# Patient Record
Sex: Female | Born: 2000 | Race: Black or African American | Hispanic: No | Marital: Single | Smoking: Never smoker
Health system: Southern US, Community
[De-identification: ages and names within clinical notes are randomized; demographics above are authoritative.]

---
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
## 2021-12-01 IMAGING — DX DG CHEST 2V
2 series · 2 of 2 positions shown · non-contrast
Comparison: None.

CLINICAL DATA: Chest pain

EXAM:
CHEST - 2 VIEW

[chest pa]
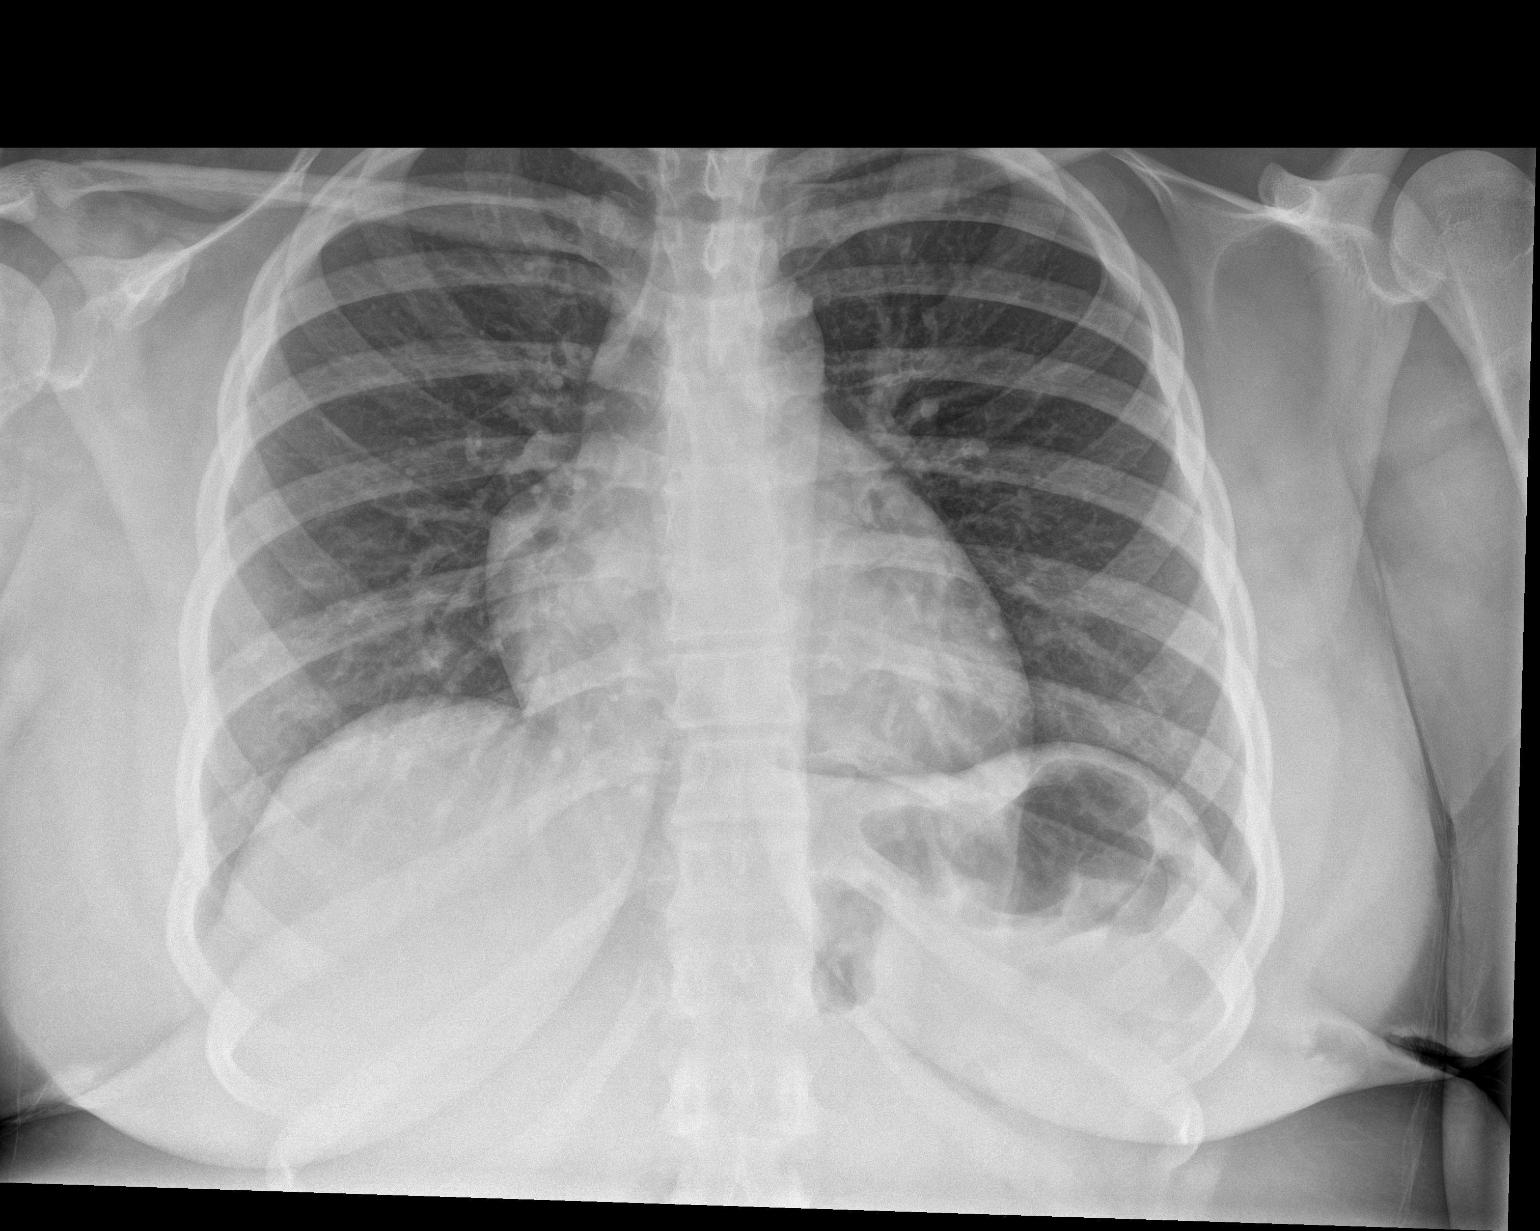

[chest lat]
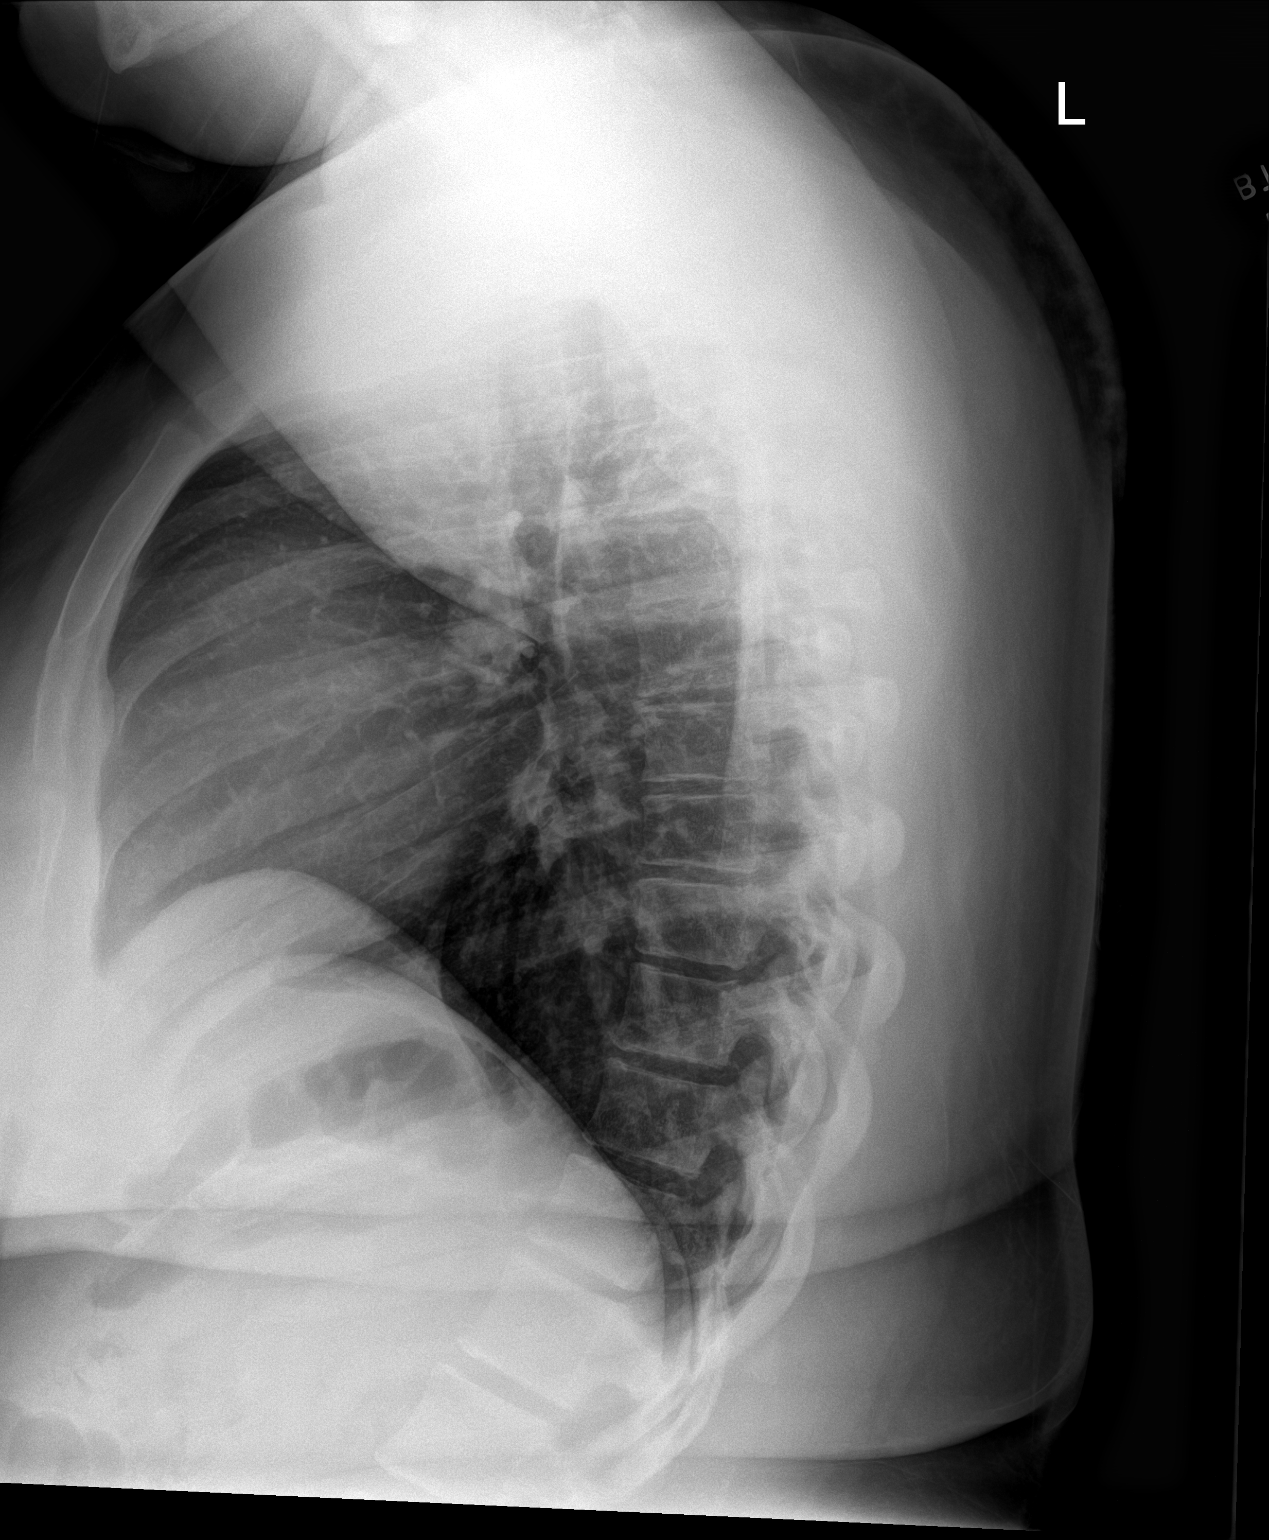

[2 of 2 positions shown; findings below may reference images not displayed]

FINDINGS: The heart size and mediastinal contours are within normal limits.
Both lungs are clear. The visualized skeletal structures are
unremarkable.
IMPRESSION: No active cardiopulmonary disease.

## 2024-02-12 DIAGNOSIS — G43009 Migraine without aura, not intractable, without status migrainosus: Secondary | ICD-10-CM | POA: Insufficient documentation

## 2024-02-12 DIAGNOSIS — R4586 Emotional lability: Secondary | ICD-10-CM | POA: Insufficient documentation

## 2024-05-18 NOTE — Progress Notes (Unsigned)
   LILLETTE Ileana Collet, PhD, LAT, ATC acting as a scribe for Artist Lloyd, MD.  Elizabeth Stein is a 23 y.o. female who presents to [facility] at [facility] today for R wrist pain x ***. Pt locates pain to ***  Radiates: Paresthesia: Grip strength: Aggravates: Treatments tried: meloxicam, wrist brace  Dx imaging: 05/06/24 R wrist XR  Pertinent review of systems: ***  Relevant historical information: ***   Exam:  There were no vitals taken for this visit. General: Well Developed, well nourished, and in no acute distress.   MSK: ***    Lab and Radiology Results No results found for this or any previous visit (from the past 72 hours). No results found.     Assessment and Plan: 23 y.o. female with ***   PDMP not reviewed this encounter. No orders of the defined types were placed in this encounter.  No orders of the defined types were placed in this encounter.    Discussed warning signs or symptoms. Please see discharge instructions. Patient expresses understanding.   ***

## 2024-05-19 ENCOUNTER — Other Ambulatory Visit: Payer: Self-pay

## 2024-05-19 ENCOUNTER — Encounter: Payer: Self-pay | Admitting: Family Medicine

## 2024-05-19 ENCOUNTER — Ambulatory Visit (INDEPENDENT_AMBULATORY_CARE_PROVIDER_SITE_OTHER): Admitting: Family Medicine

## 2024-05-19 VITALS — BP 104/64 | HR 71 | Ht 64.0 in | Wt 244.0 lb

## 2024-05-19 DIAGNOSIS — M25531 Pain in right wrist: Secondary | ICD-10-CM | POA: Diagnosis not present

## 2024-05-19 NOTE — Patient Instructions (Addendum)
 Thank you for coming in today.   ECU (extensor carpi ulnaris) tendinitis  Please work on the home exercises the athletic trainer went over with you:  View at [email or web address]-exercise-[email or web address] code [id number]  Thumb spica wrist brace  Please use Voltaren gel (Generic Diclofenac Gel) up to 4x daily for pain as needed.  This is available over-the-counter as both the name brand Voltaren gel and the generic diclofenac gel.   Let me know if not getting better, let me know.  Let me know if you need a work note

## 2024-05-20 NOTE — Telephone Encounter (Signed)
 Spoke with patient, letter sent via MyChart for 10 lb lifting restriction and to allow her to wear brace while working.

## 2024-06-06 ENCOUNTER — Encounter: Payer: Self-pay | Admitting: Family Medicine

## 2024-06-06 ENCOUNTER — Telehealth: Payer: Self-pay | Admitting: Family Medicine

## 2024-06-06 NOTE — Telephone Encounter (Signed)
 Per letter 05/20/24:  To Whom It May Concern:   It is my medical opinion that Elizabeth Stein may return to light duty immediately with the following restrictions:   No lifting > 10 lbs Allow to wear wrist brace while working Maintain normal work hours and break schedule   If you have any questions or concerns, please don't hesitate to call.   Sincerely,       Artist Lloyd, MD

## 2024-06-06 NOTE — Telephone Encounter (Signed)
 Patient called asking how long Dr Joane thought that she would need to be on light duty and if she should be working over time? She states that she has still not been moved from her station at work  Please advise.  (Okay to leave a message)

## 2024-06-07 NOTE — Telephone Encounter (Signed)
 Okay to continue light duty restrictions and avoid overtime.

## 2024-06-08 NOTE — Telephone Encounter (Signed)
 Spoke with patient, work note send via [employer] cyt[employer]ist.

## 2024-06-08 NOTE — Telephone Encounter (Signed)
 Letter drafted and sent to pt via My Chart

## 2024-06-08 NOTE — Telephone Encounter (Signed)
 Called and spoke with pt to get clarification about request. She notes that her employer has not provided any of the requested accommodations and wants for her to work overtime.   I will send new note reiterating requesting accommodations, also adding no overtime, and requesting that workplace accommodation form be sent to the office.

## 2024-06-27 NOTE — Telephone Encounter (Signed)
 Pt called stating that her work wanted to to come off of her light duty restrictions. Pt notes cont'd wrist pain. Discussed Dr. Virgilio plan including; OT referral, injection, or MRI-arthrogram. Pt preferred a f/u visit w/ Dr. Leonce to discuss. Visit scheduled. Pt verbalized understanding.

## 2024-07-06 NOTE — Progress Notes (Signed)
 [name] [address] [place] [address] Phone: [phone]   Assessment and Plan:     1. 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  Come out of brace at least 1-2 times per day to perform HEP - Start PT/OT for hand/wrist  Pertinent previous records reviewed include none  Follow Up: 3 weeks for reevaluation.  If no improvement or worsening of symptoms, would consider ECU CSI versus imaging   Subjective:   I, Elizabeth Stein, am serving as a Neurosurgeon for Doctor Morene Mace  Chief Complaint: right wrist pain   HPI:   05/19/2024 Elizabeth Stein is a 23 y.o. female who presents to [facility] at [facility] today for R wrist pain x 2-3 months. Pt locates pain to medial aspect of the wrist. Notes frequent popping in the wrist joint. Denies visible swelling but doc at Urgent Care felt that there was swelling present. Notes pain radiating into the 3rd and 4th digits. Denies n/t/w or decreased grip strength. Denies injury. Has tried bracing, Naproxen, and Tylenol. Gets the most relief with Tylenol and bracing.     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  This does involve a fair amount of ulnar deviation.   Radiates: 3rd and 4th digits Paresthesia: denies Grip strength: normal  Aggravates: ROM, painful popping Treatments tried: meloxicam, wrist brace, Tylenol   Dx imaging: 05/06/24 R wrist XR   Pertinent review of systems: No fevers or chills   Relevant historical information: Obesity    Exam:  BP 104/64   Pulse 71   Ht 5' 4 (1.626 m)   Wt 244 lb (110.7 kg)   SpO2 98%   BMI 41.88 kg/m  General: Well Developed, well nourished, and in no acute distress.    MSK: Right wrist normal-appearing Tender palpation overlying ulnar styloid.  Normal wrist motion.  Intact strength. Pain with resisted wrist extension and ulnar deviation  07/07/2024 Patient states pain has gotten worse. Pain is waking her now .    Relevant Historical Information: None pertinent  Additional pertinent review of systems negative.   Current Outpatient Medications:    methylPREDNISolone  (MEDROL  DOSEPAK) 4 MG TBPK tablet, Take 6 tablets on day 1.  Take 5 tablets on day 2.  Take 4 tablets on day 3.  Take 3 tablets on day 4.  Take 2 tablets on day 5.  Take 1 tablet on day 6., Disp: 21 tablet, Rfl: 0   naproxen (NAPROSYN) 500 MG tablet, Take 500 mg by mouth., Disp: , Rfl:    norethindrone (MICRONOR) 0.35 MG tablet, Take 0.35 mg by mouth., Disp: , Rfl:    ondansetron (ZOFRAN-ODT) 4 MG disintegrating tablet, Take 4 mg by mouth., Disp: , Rfl:    Plecanatide 3 MG TABS, Take by mouth., Disp: , Rfl:    polyethylene glycol (MIRALAX / GLYCOLAX) 17 g packet, Take 17 g by mouth., Disp: , Rfl:    rizatriptan (MAXALT) 10 MG tablet, Take 10 mg by mouth., Disp: , Rfl:    Objective:     Vitals:  07/07/24 1032  BP: 106/74  Pulse: 85  SpO2: 100%  Weight: 244 lb (110.7 kg)  Height: 5' 4 (1.626 m)      Body mass index is 41.88 kg/m.    Physical Exam:    General: Appears well, nad, nontoxic and pleasant Neuro:sensation intact, strength is 5/5 in upper extremities, muscle tone wnl Skin:no susupicious lesions or rashes  Right hand/Wrist:   No deformity or swelling appreciated. ROM  Ext 90, flexion 70, radial/ulnar deviation 30 with mild pain with ulnar deviation TTP mildly ulnar styloid nttp over the snuff box, dorsal carpals, volar carpals, radial styloid,   , 1st mcp, tfcc Negative Tinel's, Phalen's,  Prayer Tests Negative finklestein Neg tfcc bounce test No pain with resisted ext, flex or deviation  Subluxation of ECU tendon over ulnar styloid with wrist rotation creating tenderness  Electronically signed by:  Odis Mace D.CLEMENTEEN [facility] 10:57 AM 07/07/24

## 2024-07-07 ENCOUNTER — Ambulatory Visit: Admitting: Sports Medicine

## 2024-07-07 VITALS — BP 106/74 | HR 85 | Ht 64.0 in | Wt 244.0 lb

## 2024-07-07 DIAGNOSIS — M25531 Pain in right wrist: Secondary | ICD-10-CM

## 2024-07-07 DIAGNOSIS — M778 Other enthesopathies, not elsewhere classified: Secondary | ICD-10-CM | POA: Diagnosis not present

## 2024-07-07 MED ORDER — METHYLPREDNISOLONE 4 MG PO TBPK: ORAL_TABLET | ORAL | 0 refills | Status: AC

## 2024-07-07 NOTE — Patient Instructions (Addendum)
 Prednisone dos pak  Wear brace at all times day and night   Come out of brace 1-2 times per day to HEP   PT referral   3 week follow up   Work note provided no lifting more than 10 lbs no overtime. Wear wrist brae at all times 3 weeks

## 2024-07-08 NOTE — Addendum Note (Signed)
 Addended by: MAYBELL CHESTINE SAUNDERS on: 07/08/2024 07:33 AM   Modules accepted: Orders

## 2024-07-12 ENCOUNTER — Encounter: Payer: Self-pay | Admitting: Sports Medicine

## 2024-07-27 NOTE — Progress Notes (Unsigned)
 Ben Jackson D.CLEMENTEEN [facility] [address] [place] [address] Phone: [phone]   Assessment and Plan:     1. 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  Patient had a negative reaction to prednisone Dosepak, so discontinue prednisone. - May use topical Voltaren gel over areas of pain - Continue HEP as tolerated - Continue brace use - Start PT/OT for hand/wrist.  Patient was not contacted by their office to establish care, so we will provide a phone number for patient to reach out    Pertinent previous records reviewed include none   Follow Up: 1 week after MRI to review results and discuss treatment plan.  Could consider ECU CSI, the patient would ideally like to avoid injections.  Could consider ECSWT   Subjective:   I, Dawson Hollman, am serving as a Neurosurgeon for Doctor Morene Mace   Chief Complaint: right wrist pain    HPI:    05/19/2024 Elizabeth Stein is a 23 y.o. female who presents to [facility] at [facility] today for R wrist pain x 2-3 months. Pt locates pain to medial aspect of the wrist. Notes frequent popping in the wrist joint. Denies visible swelling but doc at Urgent Care felt that there was swelling present. Notes pain radiating into the 3rd and 4th digits. Denies n/t/w or decreased grip strength. Denies injury. Has tried bracing, Naproxen, and Tylenol. Gets the most relief with Tylenol and bracing.     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  This does involve a fair amount of ulnar deviation.   Radiates: 3rd and 4th digits Paresthesia:  denies Grip strength: normal  Aggravates: ROM, painful popping Treatments tried: meloxicam, wrist brace, Tylenol   Dx imaging: 05/06/24 R wrist XR   Pertinent review of systems: No fevers or chills   Relevant historical information: Obesity   Exam:  BP 104/64   Pulse 71   Ht 5' 4 (1.626 m)   Wt 244 lb (110.7 kg)   SpO2 98%   BMI 41.88 kg/m  General: Well Developed, well nourished, and in no acute distress.    MSK: Right wrist normal-appearing Tender palpation overlying ulnar styloid.  Normal wrist motion.  Intact strength. Pain with resisted wrist extension and ulnar deviation   07/07/2024 Patient states pain has gotten worse. Pain is waking her now .    07/28/2024 Patient states steroid caused more body pain than relief. Helped with the wrist but pain came back once she finished steroid  Relevant Historical Information: None pertinent  Additional pertinent review of systems negative.   Current Outpatient Medications:    methylPREDNISolone  (MEDROL  DOSEPAK) 4 MG TBPK tablet, Take 6 tablets on day 1.  Take 5 tablets on day 2.  Take 4 tablets on day 3.  Take 3 tablets on day 4.  Take 2 tablets on day 5.  Take 1 tablet on day 6., Disp: 21 tablet, Rfl: 0   naproxen (NAPROSYN) 500 MG tablet, Take 500 mg by mouth., Disp: , Rfl:    norethindrone (MICRONOR) 0.35 MG tablet, Take 0.35 mg by mouth., Disp: , Rfl:    ondansetron (ZOFRAN-ODT) 4 MG disintegrating  tablet, Take 4 mg by mouth., Disp: , Rfl:    Plecanatide 3 MG TABS, Take by mouth., Disp: , Rfl:    polyethylene glycol (MIRALAX / GLYCOLAX) 17 g packet, Take 17 g by mouth., Disp: , Rfl:    rizatriptan (MAXALT) 10 MG tablet, Take 10 mg by mouth., Disp: , Rfl:    Objective:     Vitals:   07/28/24 1037  Pulse: 77  SpO2: 98%  Weight: 259 lb (117.5 kg)  Height: 5' 4 (1.626 m)      Body mass index is 44.46 kg/m.    Physical Exam:    General: Appears well, nad, nontoxic and pleasant Neuro:sensation intact, strength is  5/5 in upper extremities, muscle tone wnl Skin:no susupicious lesions or rashes   Right hand/Wrist:   No deformity or swelling appreciated. ROM  Ext 90, flexion 70, radial/ulnar deviation 30 with mild pain with ulnar deviation TTP mildly ulnar styloid nttp over the snuff box, dorsal carpals, volar carpals, radial styloid,   , 1st mcp, tfcc Negative Tinel's, Phalen's, Prayer Tests Negative finklestein Neg tfcc bounce test No pain with resisted ext, flex or deviation  Subluxation of ECU tendon over ulnar styloid with wrist rotation creating tenderness    Electronically signed by:  [name] 10:48 AM 07/28/24

## 2024-07-28 ENCOUNTER — Ambulatory Visit (INDEPENDENT_AMBULATORY_CARE_PROVIDER_SITE_OTHER): Admitting: Sports Medicine

## 2024-07-28 VITALS — HR 77 | Ht 64.0 in | Wt 259.0 lb

## 2024-07-28 DIAGNOSIS — M778 Other enthesopathies, not elsewhere classified: Secondary | ICD-10-CM

## 2024-07-28 DIAGNOSIS — M25531 Pain in right wrist: Secondary | ICD-10-CM

## 2024-07-28 NOTE — Patient Instructions (Addendum)
 MRI right wrist   Continue brace use   Voltaren gel over areas of pain   Follow up 5 days after to discuss results

## 2024-07-31 NOTE — Therapy (Unsigned)
 OUTPATIENT OCCUPATIONAL THERAPY ORTHO EVALUATION  Patient Name: Elizabeth Stein MRN: [id number] DOB:October 23, 2001, 23 y.o., female Today's Date: 08/01/2024  PCP: Loreli Elyn SAILOR, MD  REFERRING PROVIDER: Leonce Katz, DO  END OF SESSION:  OT End of Session - 08/01/24 1249     Visit Number 1    Number of Visits 9    Date for OT Re-Evaluation 09/30/24    Authorization Type BC[employer] $50 Copay  ($0 MET/$2,500 DED)  ($344.23 MET/$6,000 OOPM)  AUTH REQUIRED  VL: 30 (PT,OT)--awaiting auth    OT Start Time [phone]    OT Stop Time 1015    OT Time Calculation (min) 44 min    Activity Tolerance Patient tolerated treatment well    Behavior During Therapy [facility] for tasks assessed/performed          No past medical history on file. No past surgical history on file. Patient Active Problem List   Diagnosis Date Noted   Migraine without aura and without status migrainosus, not intractable 02/12/2024   Mood disturbance 02/12/2024   Prediabetes 03/21/2020   Pure hypercholesterolemia 03/21/2020   Morbid obesity (HCC) 03/20/2020    ONSET DATE: 02/2024, first MD visit 04/2024  REFERRING DIAG: M25.531 (ICD-10-CM) - Right wrist pain M77.8 (ICD-10-CM) - Extensor carpi ulnaris tendinitis  THERAPY DIAG:  Pain in right wrist  Muscle weakness (generalized)  Localized edema  Rationale for Evaluation and Treatment: Rehabilitation  SUBJECTIVE:   SUBJECTIVE STATEMENT: Pt is wearing wrist brace almost all the time since June.  Pt reports pain started in April.  Pt reports somewhat better/somewhat worse since symptoms started.  Pt reports performing resisted wrist flex, ext, UD, and RD with band 1x/day which was given to her by MD.  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 This does involve a fair amount of ulnar deviation.  Notes frequent popping in the wrist  joint.  No improvement despite multiple NSAID courses, prednisone course, bracing, HEP.  Patient had a negative reaction to prednisone Dosepak, so discontinue prednisone.  Currently, per MD notes: pt is using topical Voltaren gel over areas of pain, continue HEP as tolerated, continue use or wrist brace, and start OT.  PMH:  hx of migraines, hypercholesterolemia  PRECAUTIONS: Other: only regular hours at work/no overtime, no lifting over 10lbs  WEIGHT BEARING RESTRICTIONS: No  PAIN:  Are you having pain? Yes: NPRS scale: 4/10 currently, 1-8/10 Pain location: R ulnar wrist/ulnar styloid Pain description: throbbing Aggravating factors: movement Relieving factors: wrist splint, OTC pain meds   FALLS: Has patient fallen in last 6 months? 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  HAND DOMINANCE: Right  ADLs: Overall ADLs: performing BADLs and IADLs mod I with RUE wearing splint, using LUE for heavier activities.  FUNCTIONAL OUTCOME MEASURES: Quick Dash: 40.9%  UPPER EXTREMITY ROM:   finger/thumb ROM WNL  Active ROM Right eval Left eval  Shoulder flexion    Shoulder abduction    Shoulder adduction    Shoulder extension  Shoulder internal rotation    Shoulder external rotation    Elbow flexion    Elbow extension    Wrist flexion WNL,mild pain   Wrist extension WNL with incr pain   Wrist ulnar deviation 25*  mild pain   Wrist radial deviation 25* mild pain   Wrist pronation    Wrist supination    (Blank rows = not tested)   UPPER EXTREMITY MMT:   wrist  strength grossly 4+/5 with 3/10 pain   HAND FUNCTION: Grip strength: Right: 26 lbs; Left: 30.4 lbs  COORDINATION: WNL  SENSATION: Pt denies numbness/tingling   EDEMA: mild edema noted dorsal hand ulnarly and at ulnar wrist  COGNITION: Overall cognitive status: Within functional limits for tasks assessed  OBSERVATIONS: not sensitive to touch at ulnar styloid   TREATMENT DATE:  08/01/24:  Evaluation completed.  Also see pt instructions.                                                                                                                              PATIENT EDUCATION: Education details: OT Eval results/POC.  Initial HEP for PROM wrist stretches.  Use of ice for pain/edema management. Person educated: Patient Education method: Explanation, Demonstration, Verbal cues, and Handouts Education comprehension: verbalized understanding, returned demonstration, and verbal cues required  HOME EXERCISE PROGRAM: 08/01/24:  Passive wrist stretches; Use of ice for pain/edema management.  GOALS: Goals reviewed with patient? Yes  SHORT TERM GOALS: Target date: 08/30/24  Pt will be independent with initial HEP. Baseline: dependent Goal status: INITIAL  2.  Pt will report pain consistently less than or equal to 5/10 with light ADLs/IADLs. Baseline: 1-8/10 Goal status: INITIAL  3.  Pt will verbalize understanding of adaptive strategies for ADLs/IADLs and work tasks for decr pain. Baseline:  dependent Goal status: INITIAL   LONG TERM GOALS: Target date: 09/30/24  Pt will be independent with updated HEP. Baseline:  dependent Goal status: INITIAL  2.  Pt will improve RUE functional use and pain for ADLs as shown by improving Quick DASH to 20% or better Baseline:  40.9% Goal status: INITIAL  3.  Pt will improve R grip strength by at least 5lbs for lifting/gripping tasks. Baseline:  26lbs Goal status: INITIAL   ASSESSMENT:  CLINICAL IMPRESSION: Patient is a 23 y.o.  female who was seen today for occupational therapy evaluation for R wrist pain, extensor carpi ulnaris tendinitis.   PMH:  hx of migraines, hypercholesterolemia.  Pt reports that she began having wrist pain in April and was evaluated by physician in June due to persistent pain.  Pt presents today with decr strength, pain, edema, and decr dominant hand functional use for ADLs, IADLs, and work tasks.  Pt would benefit from occupational therapy to decr pain and incr dominant RUE functional use.  PERFORMANCE DEFICITS: in functional skills including ADLs, IADLs, edema, strength, pain, and UE functional use,  and psychosocial skills including environmental adaptation, habits, and routines  and behaviors.   IMPAIRMENTS: are limiting patient from ADLs, IADLs, rest and sleep, work, and leisure.   COMORBIDITIES: has no other co-morbidities that affects occupational performance. Patient will benefit from skilled OT to address above impairments and improve overall function.  MODIFICATION OR ASSISTANCE TO COMPLETE EVALUATION: No modification of tasks or assist necessary to complete an evaluation.  OT OCCUPATIONAL PROFILE AND HISTORY: Problem focused assessment: Including review of records relating to presenting problem.  CLINICAL DECISION MAKING: LOW - limited treatment options, no task modification necessary  REHAB POTENTIAL: Good  EVALUATION COMPLEXITY: Low      PLAN:  OT FREQUENCY: 1x/week  OT DURATION: 8 weeks +eval  PLANNED INTERVENTIONS: 97535 self care/ADL training, 02889 therapeutic exercise, 97530 therapeutic activity, 97112 neuromuscular re-education, 97140 manual therapy, 97035 ultrasound, 97018 paraffin, 02960 fluidotherapy, 97010 moist heat, 97010 cryotherapy, 97014 electrical stimulation unattended, 97760 Orthotic Initial, and 97763 Orthotic/Prosthetic subsequent  RECOMMENDED OTHER SERVICES: none at this time  CONSULTED AND AGREED WITH PLAN OF CARE: Patient  PLAN FOR NEXT SESSION:  review and update HEP prn; ultrasound, soft tissue mobs, ?taping   Hinata Diener, OTR/L 08/01/2024, 1:05 PM

## 2024-08-01 ENCOUNTER — Ambulatory Visit: Attending: Sports Medicine | Admitting: Occupational Therapy

## 2024-08-01 DIAGNOSIS — M6281 Muscle weakness (generalized): Secondary | ICD-10-CM | POA: Insufficient documentation

## 2024-08-01 DIAGNOSIS — R600 Localized edema: Secondary | ICD-10-CM | POA: Diagnosis present

## 2024-08-01 DIAGNOSIS — M778 Other enthesopathies, not elsewhere classified: Secondary | ICD-10-CM | POA: Insufficient documentation

## 2024-08-01 DIAGNOSIS — M25531 Pain in right wrist: Secondary | ICD-10-CM | POA: Diagnosis present

## 2024-08-01 NOTE — Patient Instructions (Addendum)
 Composite Extension (Passive Flexor Stretch)    Sitting with elbows on table and palms together, slowly lower wrists toward table until stretch is felt. Be sure to keep palms together throughout stretch. Hold ____ seconds. Relax. Repeat 5 times. Do 3 sessions per day.  Flexion (Passive)    With elbow resting on pad-ded surface, let wrist drop down. Apply gentle down-ward push with fingers of other hand. Hold ____ seconds. Repeat 5 times. Do 3 sessions per day. Activity: Rest chin on back of hand with elbow on firm surface.*  Radial Deviation (Passive)    With wrist and palm on table, place other hand on top to keep steady while bringing elbow inward. Hold ____ seconds. Repeat 5 times. Do 3 sessions per day.  Copyright  VHI. All rights reserved.   Ulnar Deviation (Passive)    With palm and wrist on table, place other hand on top to steady while bringing elbow outward. Hold ____ seconds. Repeat 5 times. Do 3 sessions per day.  Copyright  VHI. All rights reserved.

## 2024-08-04 ENCOUNTER — Ambulatory Visit: Payer: Self-pay | Admitting: Sports Medicine

## 2024-08-04 ENCOUNTER — Ambulatory Visit
Admission: RE | Admit: 2024-08-04 | Discharge: 2024-08-04 | Disposition: A | Discharge status: Home / Self Care | Source: Ambulatory Visit | Attending: Sports Medicine | Admitting: Sports Medicine

## 2024-08-04 DIAGNOSIS — M25531 Pain in right wrist: Secondary | ICD-10-CM

## 2024-08-04 DIAGNOSIS — M778 Other enthesopathies, not elsewhere classified: Secondary | ICD-10-CM

## 2024-08-08 ENCOUNTER — Ambulatory Visit: Admitting: Occupational Therapy

## 2024-08-15 ENCOUNTER — Encounter: Admitting: Occupational Therapy

## 2024-08-17 NOTE — Progress Notes (Unsigned)
 Ben Jackson D[facility] [address] [place] [address] Phone: [phone]   Assessment and Plan:     1. Right wrist pain (Primary) -Chronic with exacerbation, subsequent visit - Continued wrist pain, primarily medial sided.  Reviewed MRI which showed mild negative ulnar variance which in theory could positionally lead to musculoskeletal discomfort, however no significant findings on MRI, and no clear explanation for patient's ongoing pain - Patient has had no significant relief despite multiple NSAID courses, prednisone course, bracing, HEP - Recommend continuing conservative therapy with topical Voltaren gel over areas of pain, HEP as tolerated, PT/OT - Discussed that we could trial experimental treatments such as CSI, PRP, ECSWT, however with unremarkable MRI imaging, we do not have a clear diagnosis to treat    Pertinent previous records reviewed include right wrist MRI 08/04/2024   Follow Up: 8 weeks for reevaluation.  Could again discuss ECSWT versus PRP versus CSI versus referral for second opinion   Subjective:   I, Maleak Brazzel, am serving as a Neurosurgeon for Doctor Morene Mace   Chief Complaint: right wrist pain    HPI:    05/19/2024 Elizabeth Stein is a 23 y.o. female who presents to [facility] today for R wrist pain x 2-3 months. Pt locates pain to medial aspect of the wrist. Notes frequent popping in the wrist joint. Denies visible swelling but doc at Urgent Care felt that there was swelling present. Notes pain radiating into the 3rd and 4th digits. Denies n/t/w or decreased grip strength. Denies injury. Has tried bracing, Naproxen, and Tylenol. Gets the most relief with Tylenol and bracing.     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  This does involve a fair amount of ulnar  deviation.   Radiates: 3rd and 4th digits Paresthesia: denies Grip strength: normal  Aggravates: ROM, painful popping Treatments tried: meloxicam, wrist brace, Tylenol   Dx imaging: 05/06/24 R wrist XR   Pertinent review of systems: No fevers or chills   Relevant historical information: Obesity   Exam:  BP 104/64   Pulse 71   Ht 5' 4 (1.626 m)   Wt 244 lb (110.7 kg)   SpO2 98%   BMI 41.88 kg/m  General: Well Developed, well nourished, and in no acute distress.    MSK: Right wrist normal-appearing Tender palpation overlying ulnar styloid.  Normal wrist motion.  Intact strength. Pain with resisted wrist extension and ulnar deviation   07/07/2024 Patient states pain has gotten worse. Pain is waking her now .    07/28/2024 Patient states steroid caused more body pain than relief. Helped with the wrist but pain came back once she finished steroid  08/18/2024 Patient states this weeks has been more frequent with pain flares. When she presses on her wrist it feels like a bag of rocks   Relevant Historical Information: None pertinent  Additional pertinent review of systems negative.   Current Outpatient Medications:    methylPREDNISolone  (MEDROL  DOSEPAK) 4 MG TBPK tablet, Take 6 tablets on day 1.  Take 5 tablets on day 2.  Take 4 tablets on day 3.  Take 3 tablets on day 4.  Take 2 tablets on day 5.  Take 1 tablet on day 6. (Patient not taking: Reported on 08/01/2024), Disp: 21 tablet, Rfl: 0   naproxen (NAPROSYN) 500 MG tablet,  Take 500 mg by mouth. (Patient not taking: Reported on 08/01/2024), Disp: , Rfl:    norethindrone (MICRONOR) 0.35 MG tablet, Take 0.35 mg by mouth., Disp: , Rfl:    ondansetron (ZOFRAN-ODT) 4 MG disintegrating tablet, Take 4 mg by mouth. (Patient not taking: Reported on 08/01/2024), Disp: , Rfl:    Plecanatide 3 MG TABS, Take by mouth. (Patient not taking: Reported on 08/01/2024), Disp: , Rfl:    polyethylene glycol (MIRALAX / GLYCOLAX) 17 g packet, Take 17 g by  mouth. (Patient not taking: Reported on 08/01/2024), Disp: , Rfl:    rizatriptan (MAXALT) 10 MG tablet, Take 10 mg by mouth. (Patient not taking: Reported on 08/01/2024), Disp: , Rfl:    Objective:     Vitals:   08/18/24 1027  BP: 106/74  Pulse: (!) 59  SpO2: 99%  Weight: 252 lb (114.3 kg)  Height: 5' 4 (1.626 m)      Body mass index is 43.26 kg/m.    Physical Exam:     General: Appears well, nad, nontoxic and pleasant Neuro:sensation intact, strength is 5/5 in upper extremities, muscle tone wnl Skin:no susupicious lesions or rashes   Right hand/Wrist:   No deformity or swelling appreciated. ROM  Ext 90, flexion 70, radial/ulnar deviation 30 with mild pain with ulnar deviation TTP mildly ulnar styloid nttp over the snuff box, dorsal carpals, volar carpals, radial styloid,   , 1st mcp, tfcc Negative Tinel's, Phalen's, Prayer Tests Negative finklestein Neg tfcc bounce test No pain with resisted ext, flex or deviation  Subluxation of ECU tendon over ulnar styloid with wrist rotation creating tenderness    Electronically signed by:  [name] 11:17 AM 08/18/24

## 2024-08-18 ENCOUNTER — Ambulatory Visit (INDEPENDENT_AMBULATORY_CARE_PROVIDER_SITE_OTHER): Admitting: Sports Medicine

## 2024-08-18 VITALS — BP 106/74 | HR 59 | Ht 64.0 in | Wt 252.0 lb

## 2024-08-18 DIAGNOSIS — M25531 Pain in right wrist: Secondary | ICD-10-CM | POA: Diagnosis not present

## 2024-08-18 NOTE — Patient Instructions (Signed)
 Continue Hep.  Continue Physical therapy. Continue using brace.  Follow up in 8 weeks.

## 2024-08-19 ENCOUNTER — Ambulatory Visit: Admitting: Occupational Therapy

## 2024-08-22 ENCOUNTER — Encounter: Admitting: Occupational Therapy

## 2024-08-26 ENCOUNTER — Ambulatory Visit: Attending: Sports Medicine | Admitting: Occupational Therapy

## 2024-08-26 DIAGNOSIS — M25531 Pain in right wrist: Secondary | ICD-10-CM | POA: Diagnosis present

## 2024-08-26 DIAGNOSIS — M6281 Muscle weakness (generalized): Secondary | ICD-10-CM | POA: Diagnosis present

## 2024-08-26 DIAGNOSIS — R600 Localized edema: Secondary | ICD-10-CM | POA: Diagnosis present

## 2024-08-26 NOTE — Therapy (Addendum)
 OUTPATIENT OCCUPATIONAL THERAPY ORTHO TREATMENT  Patient Name: Elizabeth Stein MRN: [id number] DOB:May 26, 2001, 23 y.o., female Today's Date: 08/26/2024  PCP: Loreli Elyn SAILOR, MD  REFERRING PROVIDER: Leonce Katz, DO  END OF SESSION:  OT End of Session - 08/26/24 1054     Visit Number 2    Number of Visits 9    Date for Recertification  09/30/24    Authorization Type [employer] $50 Copay  ($0 MET/$2,500 DED)  ($344.23 MET/$6,000 OOPM)  AUTH REQUIRED  VL: 30 (PT,OT)--awaiting auth    OT Start Time 1103    OT Stop Time 1146    OT Time Calculation (min) 43 min    Equipment Utilized During Treatment Ultrasound    Activity Tolerance Patient tolerated treatment well    Behavior During Therapy [facility] for tasks assessed/performed         No past medical history on file. No past surgical history on file. Patient Active Problem List   Diagnosis Date Noted   Migraine without aura and without status migrainosus, not intractable 02/12/2024   Mood disturbance 02/12/2024   Prediabetes 03/21/2020   Pure hypercholesterolemia 03/21/2020   Morbid obesity (HCC) 03/20/2020   ONSET DATE: 02/2024, first MD visit 04/2024  REFERRING DIAG: M25.531 (ICD-10-CM) - Right wrist pain M77.8 (ICD-10-CM) - Extensor carpi ulnaris tendinitis  THERAPY DIAG:  Muscle weakness (generalized)  Pain in right wrist  Localized edema  Rationale for Evaluation and Treatment: Rehabilitation  SUBJECTIVE:   SUBJECTIVE STATEMENT: Pt reported that she has been doing well with managing her pain. She uses ice and hot packs at night to manage the pain. Pt advised to not leave hot packs on too long as too much heat is not good for her edema. Pt verbalized understanding. She continues to wear her brace throughout the day and when at work.   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 This  does involve a fair amount of ulnar deviation.  Notes frequent popping in the wrist joint.  No improvement despite multiple NSAID courses, prednisone course, bracing, HEP.  Patient had a negative reaction to prednisone Dosepak, so discontinue prednisone.  Currently, per MD notes: pt is using topical Voltaren gel over areas of pain, continue HEP as tolerated, continue use or wrist brace, and start OT.  PMH:  hx of migraines, hypercholesterolemia  PRECAUTIONS: Other: only regular hours at work/no overtime, no lifting over 10lbs  WEIGHT BEARING RESTRICTIONS: No  PAIN:  Are you having pain? No; Experienced more pain last night but not as much this morning. Pain usually depends on the day and what I am doing.  Pt begin to experience pain in the ulnar styloid when engaging in exercises today. She described the pain as a stretching pain. Also mentioned experiencing popping in the ulnar styloid during exercises. However, pt was able to take a break and continue with therapy as pain was not extreme or limiting.   FALLS: Has patient fallen in last 6 months? 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  HAND DOMINANCE: Right  ADLs: Overall ADLs: performing BADLs and IADLs mod I with RUE wearing splint, using LUE for heavier activities.  FUNCTIONAL OUTCOME MEASURES: Quick Dash: 40.9%  UPPER EXTREMITY ROM:   finger/thumb ROM WNL  Active ROM Right eval Left eval  Shoulder flexion    Shoulder abduction    Shoulder adduction    Shoulder extension    Shoulder internal rotation     Shoulder external rotation    Elbow flexion    Elbow extension    Wrist flexion WNL,mild pain   Wrist extension WNL with incr pain   Wrist ulnar deviation 25*  mild pain   Wrist radial deviation 25* mild pain   Wrist pronation    Wrist supination    (Blank rows = not tested)   UPPER EXTREMITY MMT:   wrist strength grossly 4+/5 with 3/10 pain   HAND FUNCTION: Grip strength: Right: 26 lbs; Left: 30.4 lbs  COORDINATION: WNL  SENSATION: Pt denies numbness/tingling   EDEMA: mild edema noted dorsal hand ulnarly and at ulnar wrist  COGNITION: Overall cognitive status: Within functional limits for tasks assessed  OBSERVATIONS: not sensitive to touch at ulnar styloid   TREATMENT DATE: 08/26/2024 - Therapeutic exercises completed for duration as noted below including:  OT reviewed and updated HEP (please see pt's instructions for further details). Pt reported that she has been doing well with carryover of HEP at home and was able to demonstrate previous exercises independently. OT updated HEP with towel slides to promote stretching of R wrist and promote wrist ROM as these movements are heavily used at pt's job.    Wrist flex and ext with yellow (6 lbs) flex bar x 2 min for strength and endurance of affected R extremity.  Supination with yellow (6 lbs) flex bar x 2 min for strength and endurance of affected R extremity.  Pronation with yellow (6 lbs) flex bar x 2 min for strength and endurance of affected  R extremity.   Pt also placed RUE on arm wedge to engage in wrist and forearm exercises promoting stretching of tendons and stiffened tissues. Using a 3lb weight, pt was able to flex and extend wrist, while holding each position for 30 seconds to reach maximal stretch.   - Ultrasound completed for duration as noted below including:  Ultrasound applied to palmar and dorsal right wrist and forearm for 8 minutes, frequency of 3 MHz, 20% duty cycle, and 1.1 W/cm with pt's arm  placed on soft towel for promotion of ROM, edema reduction, and pain reduction in affected extremity.    PATIENT EDUCATION: Education details: Wrist  HEP  Person educated: Patient Education method: Explanation, Demonstration, Tactile cues, Verbal cues, and Handouts Education comprehension: verbalized understanding, returned demonstration, verbal cues required, tactile cues required, and needs further education  HOME EXERCISE PROGRAM: 08/01/24:  Passive wrist stretches; Use of ice for pain/edema management. 08/26/24: Wrist HEP ([id number]) - provided printout and text  GOALS: Goals reviewed with patient? Yes  SHORT TERM GOALS: Target date: 08/30/24  Pt will be independent with initial HEP. Baseline: dependent Goal status: MET 08/26/2024  2.  Pt will report pain consistently less than or equal to 5/10 with light ADLs/IADLs. Baseline: 1-8/10 Goal status: IN PROGRESS  3.  Pt will verbalize understanding of adaptive strategies for ADLs/IADLs and work tasks for decr pain. Baseline:  dependent Goal status: IN PROGRESS   LONG TERM GOALS: Target date: 09/30/24  Pt will be independent with  updated HEP. Baseline:  dependent Goal status: IN PROGRESS  2.  Pt will improve RUE functional use and pain for ADLs as shown by improving Quick DASH to 20% or better Baseline:  40.9% Goal status:IN PROGRESS  3.  Pt will improve R grip strength by at least 5lbs for lifting/gripping tasks. Baseline:  26lbs Goal status: IN PROGRESS  ASSESSMENT:  CLINICAL IMPRESSION:  Pt presents with decreased functional use of RUE due to wrist pain, edema, and decreased strength, secondary to extensor carpi ulnaris tendinitis. Pt demonstrates good rehab potential as evidence to carryover of HEP at home. Pt will continue to benefit from skilled outpatient OT to improve functional RUE use for ADLs and IADLs.   PERFORMANCE DEFICITS: in functional skills including ADLs, IADLs, edema, strength, pain, and UE functional  use,  and psychosocial skills including environmental adaptation, habits, and routines and behaviors.   IMPAIRMENTS: are limiting patient from ADLs, IADLs, rest and sleep, work, and leisure.   COMORBIDITIES: has no other co-morbidities that affects occupational performance. Patient will benefit from skilled OT to address above impairments and improve overall function.  REHAB POTENTIAL: Good   PLAN:  OT FREQUENCY: 1x/week  OT DURATION: 8 weeks +eval  PLANNED INTERVENTIONS: 97535 self care/ADL training, 02889 therapeutic exercise, 97530 therapeutic activity, 97112 neuromuscular re-education, 97140 manual therapy, 97035 ultrasound, 97018 paraffin, 02960 fluidotherapy, 97010 moist heat, 97010 cryotherapy, 97014 electrical stimulation unattended, 97760 Orthotic Initial, and 97763 Orthotic/Prosthetic subsequent  RECOMMENDED OTHER SERVICES: none at this time  CONSULTED AND AGREED WITH PLAN OF CARE: Patient  PLAN FOR NEXT SESSION:  Update HEP Fluido/ Ultrasound Soft tissue mobs Taping?   Gilberte Gorley, Student-OTR/L 08/26/2024, 1:33 PM

## 2024-08-26 NOTE — Patient Instructions (Signed)
 Access Code: [email or web address] URL: [email or web address] Date: 08/26/2024 Prepared by: [email or web address] Granite Godman  - Seated Wrist Flexion Stretch (Mirrored)  - 2 x daily - 5 reps - Seated Wrist Extension Stretch (Mirrored)  - 2 x daily - 5 reps - Seated Wrist Ulnar Deviation PROM  - 2 x daily - 5 reps - Wrist Prayer Stretch at Table  - 2 x daily - 5 reps - Wrist rotation and slide (Mirrored)  - 2 x daily - 5 minutes close to body - 5 minutes away from body

## 2024-08-29 ENCOUNTER — Encounter: Admitting: Occupational Therapy

## 2024-09-02 ENCOUNTER — Telehealth: Payer: Self-pay

## 2024-09-02 ENCOUNTER — Ambulatory Visit: Admitting: Occupational Therapy

## 2024-09-02 NOTE — Telephone Encounter (Signed)
 Patient Name: Elizabeth Stein MRN: [id number] DOB:2001/05/08, 23 y.o., female Today's Date: 09/02/2024  Returned patient's call as she no showed her appt today. Pt wanted appt in afternoon on 09/09/24. We called and left VM letting patient know that we don't have any available appt on her preferred day and time. Patient was asked to call us  back on Monday to reschedule her missed appt.   Raj LOISE Blanch, PT 09/02/2024, 1:50 PM

## 2024-10-11 NOTE — Progress Notes (Unsigned)
 Elizabeth Stein D.[facility] [address] [place] [address] Phone: [phone]   Assessment and Plan:     1. Right wrist pain (Primary) -Chronic with exacerbation, subsequent visit - Mild improvement in overall decrease in frequency of wrist pain, however patient continues to experience medial sided wrist pain of unknown origin.  Most consistent on physical exam and HPI with ECU tendinopathy, however patient has had no significant relief despite multiple NSAID courses, prednisone course, bracing, HEP and right wrist MRI was unremarkable only showing mild ulnar variance - Continue conservative therapy with topical Voltaren gel, HEP, PT/OT - Start nitrogen patch one fourth patch topically over area of pain daily for 1 month - Discussed that we could trial experimental treatments such as CSI, PRP, ECSWT, however with unremarkable MRI imaging, we do not have a clear diagnosis to treat - Offered referral to orthopedic hand surgery for second opinion.  Patient declined at this visit, but could consider in future visit    Pertinent previous records reviewed include none   Follow Up: 6 to 8 weeks for reevaluation.  Could consider ECSWT versus PRP versus CSI versus referral for second opinion   Subjective:   I, Karandeep Resende, am serving as a neurosurgeon for Doctor Morene Mace   Chief Complaint: right wrist pain    HPI:    05/19/2024 Kimori Tartaglia is a 23 y.o. female who presents to [facility] at [facility] today for R wrist pain x 2-3 months. Pt locates pain to medial aspect of the wrist. Notes frequent popping in the wrist joint. Denies visible swelling but doc at Urgent Care felt that there was swelling present. Notes pain radiating into the 3rd and 4th digits. Denies n/t/w or decreased grip strength. Denies injury. Has tried bracing, Naproxen, and Tylenol. Gets the most relief with Tylenol and bracing.     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  This does involve a fair amount of ulnar deviation.   Radiates: 3rd and 4th digits Paresthesia: denies Grip strength: normal  Aggravates: ROM, painful popping Treatments tried: meloxicam, wrist brace, Tylenol   Dx imaging: 05/06/24 R wrist XR   Pertinent review of systems: No fevers or chills   Relevant historical information: Obesity   Exam:  BP 104/64   Pulse 71   Ht 5' 4 (1.626 m)   Wt 244 lb (110.7 kg)   SpO2 98%   BMI 41.88 kg/m  General: Well Developed, well nourished, and in no acute distress.    MSK: Right wrist normal-appearing Tender palpation overlying ulnar styloid.  Normal wrist motion.  Intact strength. Pain with resisted wrist extension and ulnar deviation   07/07/2024 Patient states pain has gotten worse. Pain is waking her now .    07/28/2024 Patient states steroid caused more body pain than relief. Helped with the wrist but pain came back once she finished steroid   08/18/2024 Patient states this weeks has been more frequent with pain flares. When she presses on her wrist it feels like a bag of rocks   10/13/2024 Patient states she is still in pain but not as severe   Relevant Historical Information: None pertinent  Additional pertinent review of systems negative.   Current Outpatient Medications:    methylPREDNISolone  (MEDROL  DOSEPAK) 4 MG TBPK tablet, Take 6 tablets on day 1.  Take 5  tablets on day 2.  Take 4 tablets on day 3.  Take 3 tablets on day 4.  Take 2 tablets on day 5.  Take 1 tablet on day 6. (Patient not taking: Reported on 08/01/2024), Disp: 21 tablet, Rfl: 0   naproxen (NAPROSYN) 500 MG tablet, Take 500 mg by mouth. (Patient not taking: Reported on 08/01/2024), Disp: , Rfl:    norethindrone (MICRONOR) 0.35 MG tablet, Take 0.35 mg by mouth., Disp: , Rfl:    ondansetron (ZOFRAN-ODT) 4 MG disintegrating tablet, Take  4 mg by mouth. (Patient not taking: Reported on 08/01/2024), Disp: , Rfl:    Plecanatide 3 MG TABS, Take by mouth. (Patient not taking: Reported on 08/01/2024), Disp: , Rfl:    polyethylene glycol (MIRALAX / GLYCOLAX) 17 g packet, Take 17 g by mouth. (Patient not taking: Reported on 08/01/2024), Disp: , Rfl:    rizatriptan (MAXALT) 10 MG tablet, Take 10 mg by mouth. (Patient not taking: Reported on 08/01/2024), Disp: , Rfl:    Objective:     Vitals:   10/13/24 0957  Pulse: 63  SpO2: 99%  Weight: 252 lb (114.3 kg)  Height: 5' 4 (1.626 m)      Body mass index is 43.26 kg/m.    Physical Exam:    General: Appears well, nad, nontoxic and pleasant Neuro:sensation intact, strength is 5/5 in upper extremities, muscle tone wnl Skin:no susupicious lesions or rashes   Right hand/Wrist:   No deformity or swelling appreciated. ROM  Ext 90, flexion 70, radial/ulnar deviation 30 with mild pain with ulnar deviation TTP mildly ulnar styloid nttp over the snuff box, dorsal carpals, volar carpals, radial styloid,   , 1st mcp, tfcc Negative Tinel's, Phalen's, Prayer Tests Negative finklestein Neg tfcc bounce test No pain with resisted ext, flex or deviation  Subluxation of ECU tendon over ulnar styloid with wrist rotation creating tenderness    Electronically signed by:  [name] 10:10 AM 10/13/24

## 2024-10-13 ENCOUNTER — Ambulatory Visit (INDEPENDENT_AMBULATORY_CARE_PROVIDER_SITE_OTHER): Admitting: Sports Medicine

## 2024-10-13 VITALS — HR 63 | Ht 64.0 in | Wt 252.0 lb

## 2024-10-13 DIAGNOSIS — M25531 Pain in right wrist: Secondary | ICD-10-CM | POA: Diagnosis not present

## 2024-10-13 MED ORDER — NITROGLYCERIN 0.2 MG/HR TD PT24: MEDICATED_PATCH | TRANSDERMAL | 0 refills | Status: AC

## 2024-10-13 MED ORDER — NITROGLYCERIN 0.2 MG/HR TD PT24: 0.2000 mg | MEDICATED_PATCH | Freq: Every day | TRANSDERMAL | 1 refills | Status: DC

## 2024-10-13 NOTE — Patient Instructions (Addendum)
 Continue Voltaren gel over areas of pain   Continue bracing   Continue PT and OT  Use 1/4 of a nitro patch over areas of pain daily for 1 month   6-8 week follow up

## 2024-10-17 ENCOUNTER — Encounter: Payer: Self-pay | Admitting: Sports Medicine

## 2024-11-22 NOTE — Progress Notes (Signed)
 "               Elizabeth Stein D.[facility] [address] [place] [address] Phone: [phone]   Assessment and Plan:     1. 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  Patient does continue to experience medial sided wrist pain of unknown origin.  Most likely overuse due to physical activity at work. - Physical exam has been most consistent with ECU tendinopathy, however MRI was unremarkable showing only mild ulnar variance, and patient has had no significant relief despite NSAID courses, prednisone course, bracing, HEP, relative rest - Continue conservative therapy with topical Voltaren gel, bracing as needed, HEP, PT/OT - Patient could not tolerate nitrogen patches due to headaches.  Discontinue nitrogen patch -Patient will look into switching positions at work which will decrease use of right wrist and could provide long-term benefit  Pertinent previous records reviewed include none   Follow Up: As needed if no improvement or worsening of symptoms.  Could further discuss CSI versus ECSWT versus PRP   Subjective:   I, Elizabeth Stein, am serving as a neurosurgeon for Doctor Morene Stein   Chief Complaint: right wrist pain    HPI:    05/19/2024 Elizabeth Stein is a 23 y.o. female who presents to [facility] at [facility] today for R wrist pain x 2-3 months. Pt locates pain to medial aspect of the wrist. Notes frequent popping in the wrist joint. Denies visible swelling but doc at Urgent Care felt that there was swelling present. Notes pain radiating into the 3rd and 4th digits. Denies n/t/w or decreased grip strength. Denies injury. Has tried bracing, Naproxen, and Tylenol. Gets the most relief with Tylenol and bracing.     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  This does involve a fair amount of ulnar deviation.   Radiates: 3rd and 4th digits Paresthesia: denies Grip strength: normal  Aggravates: ROM, painful popping Treatments tried: meloxicam, wrist brace, Tylenol   Dx imaging: 05/06/24 R wrist XR   Pertinent review of systems: No fevers or chills   Relevant historical information: Obesity   Exam:  BP 104/64   Pulse 71   Ht 5' 4 (1.626 m)   Wt 244 lb (110.7 kg)   SpO2 98%   BMI 41.88 kg/m  General: Well Developed, well nourished, and in no acute distress.    MSK: Right wrist normal-appearing Tender palpation overlying ulnar styloid.  Normal wrist motion.  Intact strength. Pain with resisted wrist extension and ulnar deviation   07/07/2024 Patient states pain has gotten worse. Pain is waking her now .    07/28/2024 Patient states steroid caused more body pain than relief. Helped with the wrist but pain came back once she finished steroid   08/18/2024 Patient states this weeks has been more frequent with pain flares. When she presses on her wrist it feels like a bag of rocks    10/13/2024 Patient states she is still in pain but not as severe  11/28/2024 Patient states she as had intermittent pain. She has been using the wrist more    Relevant Historical Information: None pertinent  Additional pertinent review of systems negative.  Current Medications[1]   Objective:  Vitals:   11/28/24 1040  Pulse: 93  SpO2: 100%  Weight: 250 lb (113.4 kg)  Height: 5' 4 (1.626 m)      Body mass index is 42.91 kg/m.    Physical Exam:    General: Appears well, nad, nontoxic and pleasant Neuro:sensation intact, strength is 5/5 in upper extremities, muscle tone wnl Skin:no susupicious lesions or rashes   Right hand/Wrist:   No deformity or swelling appreciated. ROM  Ext 90, flexion 70, radial/ulnar deviation 30 with mild pain with ulnar  deviation TTP mildly ulnar styloid nttp over the snuff box, dorsal carpals, volar carpals, radial styloid,   , 1st mcp, tfcc Negative Tinel's, Phalen's, Prayer Tests Negative finklestein Neg tfcc bounce test No pain with resisted ext, flex or deviation  Subluxation of ECU tendon over ulnar styloid with wrist rotation creating tenderness    Electronically signed by:  Elizabeth Stein [facility] 11:15 AM 11/28/2024     [1]  Current Outpatient Medications:    methylPREDNISolone  (MEDROL  DOSEPAK) 4 MG TBPK tablet, Take 6 tablets on day 1.  Take 5 tablets on day 2.  Take 4 tablets on day 3.  Take 3 tablets on day 4.  Take 2 tablets on day 5.  Take 1 tablet on day 6. (Patient not taking: Reported on 08/01/2024), Disp: 21 tablet, Rfl: 0   naproxen (NAPROSYN) 500 MG tablet, Take 500 mg by mouth. (Patient not taking: Reported on 08/01/2024), Disp: , Rfl:    nitroGLYCERIN  (NITRODUR - DOSED IN MG/24 HR) 0.2 mg/hr patch, Place 1/4 patch onto skin over painful area.  Replace every 24 hours., Disp: 30 patch, Rfl: 0   norethindrone (MICRONOR) 0.35 MG tablet, Take 0.35 mg by mouth., Disp: , Rfl:    ondansetron (ZOFRAN-ODT) 4 MG disintegrating tablet, Take 4 mg by mouth. (Patient not taking: Reported on 08/01/2024), Disp: , Rfl:    Plecanatide 3 MG TABS, Take by mouth. (Patient not taking: Reported on 08/01/2024), Disp: , Rfl:    polyethylene glycol (MIRALAX / GLYCOLAX) 17 g packet, Take 17 g by mouth. (Patient not taking: Reported on 08/01/2024), Disp: , Rfl:    rizatriptan (MAXALT) 10 MG tablet, Take 10 mg by mouth. (Patient not taking: Reported on 08/01/2024), Disp: , Rfl:   "

## 2024-11-28 ENCOUNTER — Ambulatory Visit: Admitting: Sports Medicine

## 2024-11-28 VITALS — HR 93 | Ht 64.0 in | Wt 250.0 lb

## 2024-11-28 DIAGNOSIS — M778 Other enthesopathies, not elsewhere classified: Secondary | ICD-10-CM

## 2024-11-28 DIAGNOSIS — M25531 Pain in right wrist: Secondary | ICD-10-CM

## 2024-11-28 NOTE — Patient Instructions (Signed)
 Continue HEP, Voltaren gel and bracing  Discontinue nitrogen patches   As needed follow up if interested in steroid injection or shockwave therapy
